# Patient Record
Sex: Female | Born: 1935 | Race: White | Hispanic: No | Marital: Married
Health system: Southern US, Community
[De-identification: ages and names within clinical notes are randomized; demographics above are authoritative.]

---

## 2000-09-30 ENCOUNTER — Other Ambulatory Visit: Admission: RE | Admit: 2000-09-30 | Discharge: 2000-09-30 | Payer: Self-pay | Admitting: Family Medicine

## 2004-08-20 ENCOUNTER — Ambulatory Visit: Payer: Self-pay | Admitting: Internal Medicine

## 2005-01-09 ENCOUNTER — Ambulatory Visit: Payer: Self-pay | Admitting: Unknown Physician Specialty

## 2005-10-22 ENCOUNTER — Ambulatory Visit: Payer: Self-pay | Admitting: Internal Medicine

## 2005-11-13 ENCOUNTER — Encounter: Payer: Self-pay | Admitting: Internal Medicine

## 2005-12-08 ENCOUNTER — Encounter: Payer: Self-pay | Admitting: Internal Medicine

## 2006-03-24 ENCOUNTER — Ambulatory Visit: Payer: Self-pay | Admitting: Internal Medicine

## 2006-10-27 ENCOUNTER — Ambulatory Visit: Payer: Self-pay | Admitting: Internal Medicine

## 2007-11-02 ENCOUNTER — Ambulatory Visit: Payer: Self-pay | Admitting: Internal Medicine

## 2008-03-18 ENCOUNTER — Emergency Department: Payer: Self-pay | Admitting: Emergency Medicine

## 2008-03-21 ENCOUNTER — Encounter: Admission: RE | Admit: 2008-03-21 | Discharge: 2008-03-21 | Payer: Self-pay | Admitting: *Deleted

## 2008-03-22 ENCOUNTER — Ambulatory Visit (HOSPITAL_BASED_OUTPATIENT_CLINIC_OR_DEPARTMENT_OTHER): Admission: RE | Admit: 2008-03-22 | Discharge: 2008-03-23 | Payer: Self-pay | Admitting: *Deleted

## 2008-11-02 ENCOUNTER — Ambulatory Visit: Payer: Self-pay | Admitting: Internal Medicine

## 2009-05-22 IMAGING — CR DG CHEST 2V
2 series · 2 of 2 positions shown · non-contrast
Comparison: None of

CLINICAL DATA: Preop respiratory exam for surgical repair wrist
fracture.

CHEST - 2 VIEW

[view not recorded (1 of 2)]
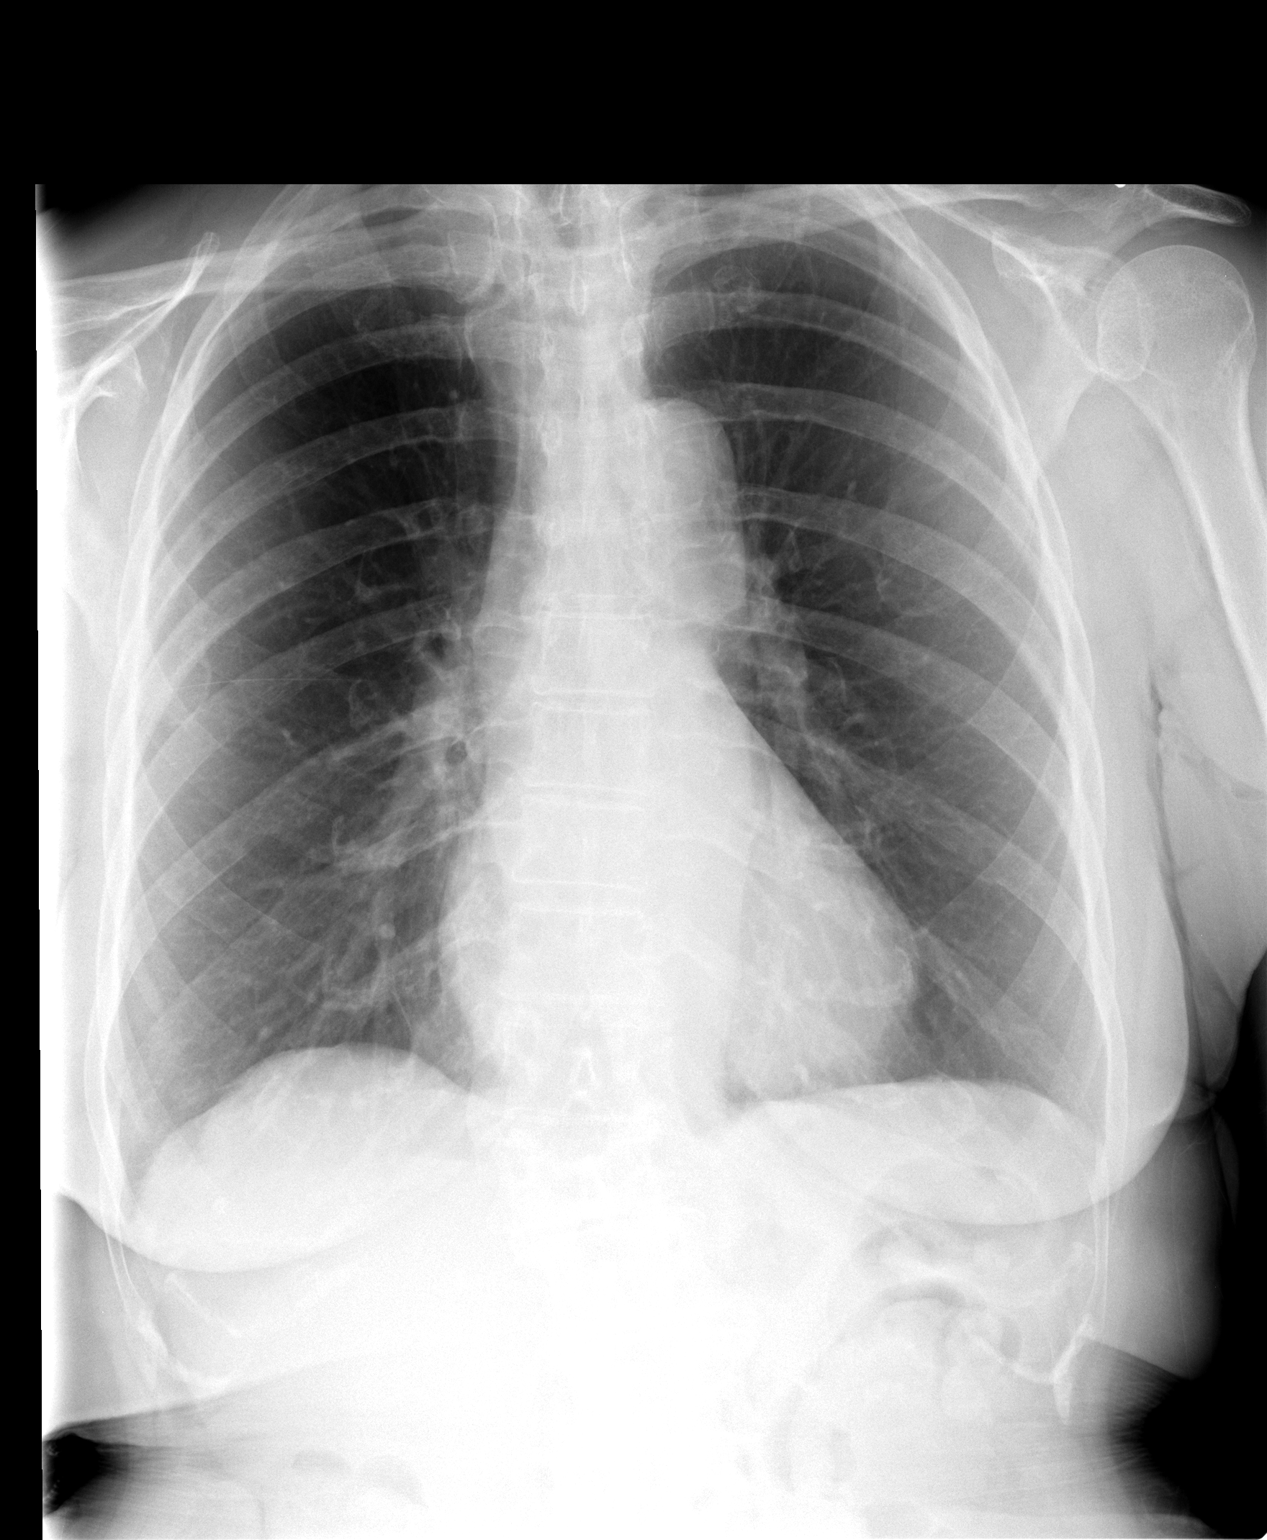

[view not recorded (2 of 2)]
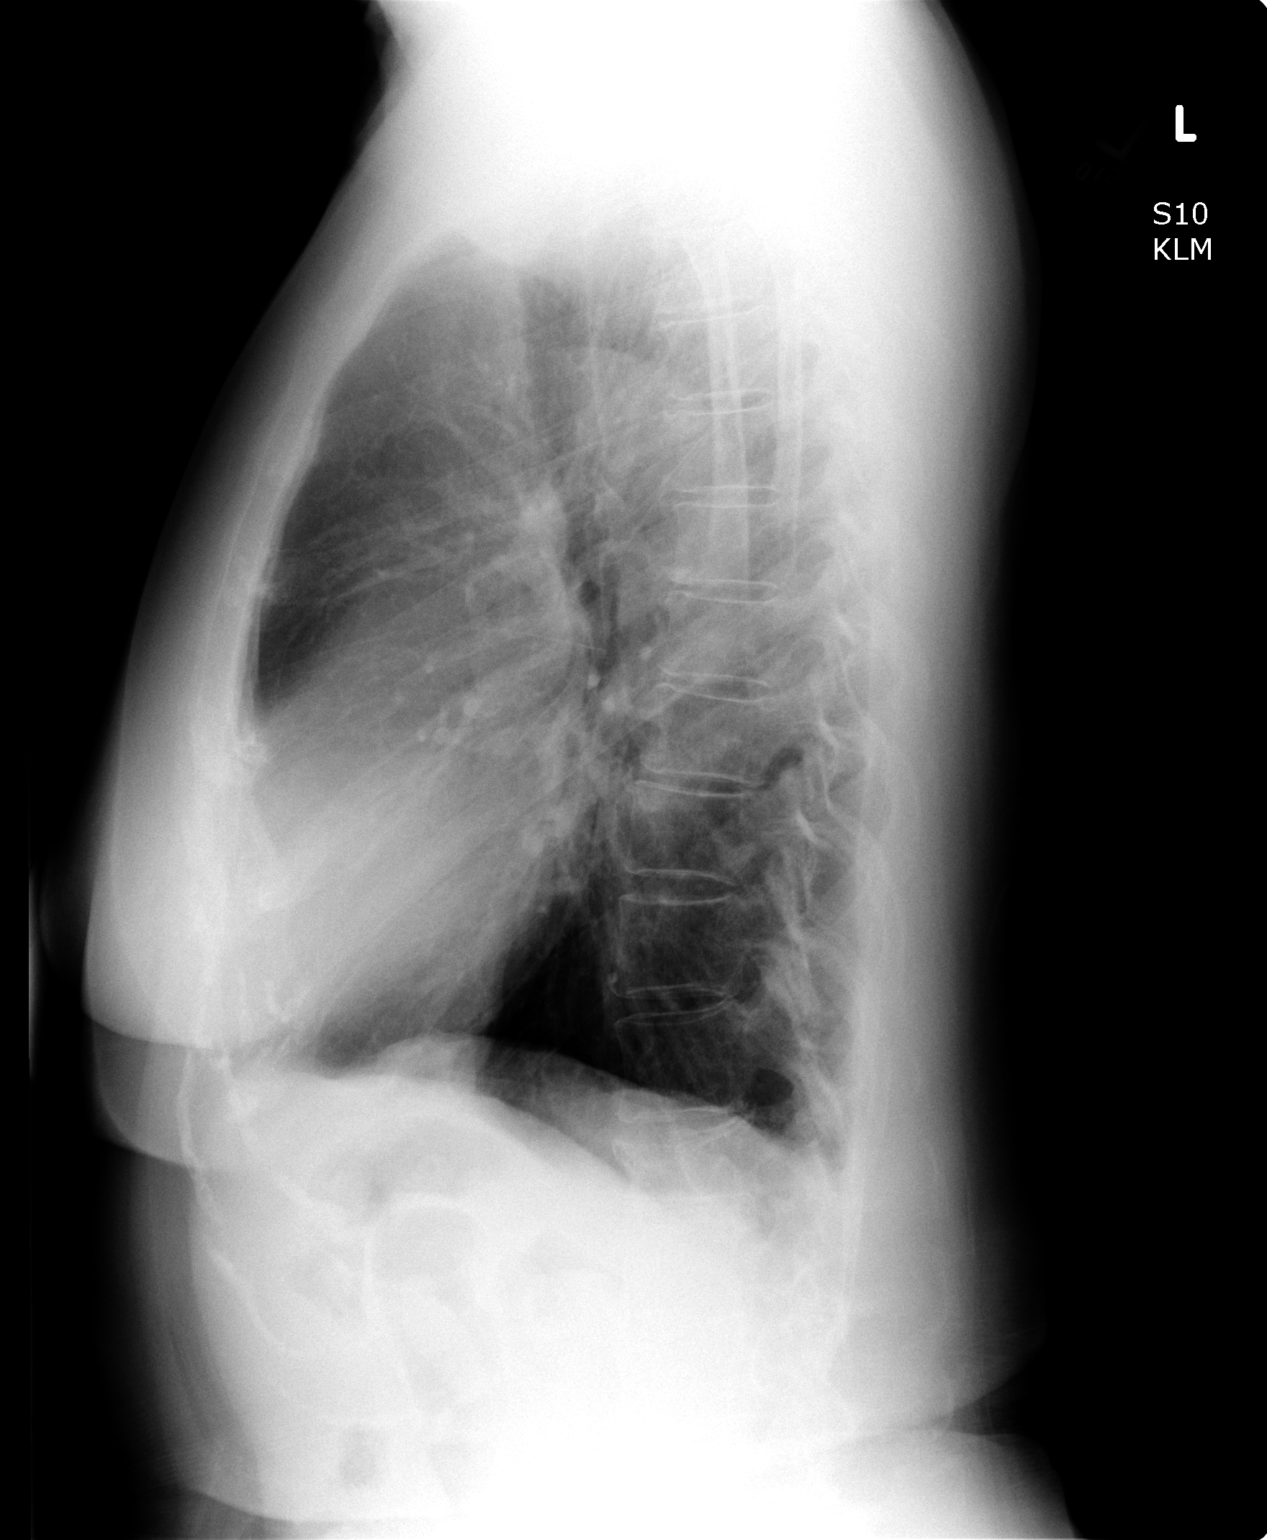

[2 of 2 positions shown; findings below may reference images not displayed]

FINDINGS: Lungs are clear.  Heart size is normal.  Mediastinum,
hila, pleura appear normal.  Slight levoscoliosis with multilevel
slight degenerative changes seen at thoracolumbar spine junction.
IMPRESSION: No active cardiopulmonary disease.

## 2009-11-28 ENCOUNTER — Ambulatory Visit: Payer: Self-pay | Admitting: Internal Medicine

## 2010-06-24 LAB — BASIC METABOLIC PANEL
BUN: 16 mg/dL (ref 6–23)
Chloride: 100 mEq/L (ref 96–112)
GFR calc Af Amer: >60 mL/min (ref >60)
GFR calc non Af Amer: >60 mL/min (ref >60)
Potassium: 3.8 mEq/L (ref 3.5–5.1)
Sodium: 137 mEq/L (ref 135–145)

## 2010-06-24 LAB — POCT HEMOGLOBIN-HEMACUE: Hemoglobin: 13.7 g/dL (ref 12.0–15.0)

## 2010-07-23 NOTE — Op Note (Signed)
[name], Valerie Roman NO.:  [id number]   MEDICAL RECORD NO.:  [id number]          PATIENT TYPE:  AMB   LOCATION:  DSC                          FACILITY:  [facility]   PHYSICIAN:  Tennis Must Meyerdierks, M.D.DATE OF BIRTH:  Apr 27, 1935   DATE OF PROCEDURE:  03/22/2008  DATE OF DISCHARGE:                               OPERATIVE REPORT   PREOPERATIVE DIAGNOSIS:  Fracture, left distal radius.   POSTOPERATIVE DIAGNOSIS:  Fracture, left distal radius.   PROCEDURE:  Open reduction and internal fixation, left distal radius.   SURGEON:  Lowell Bouton, MD   ANESTHESIA:  General.   OPERATIVE FINDINGS:  The patient had an extra-articular fracture of the  distal radius.  It was just proximal to the joint.   PROCEDURE:  Under general anesthesia with a tourniquet on the left arm.  Left hand was prepped and draped in usual fashion and after  exsanguinating the limb, the tourniquet was inflated to 250 mmHg.  A  longitudinal incision was made overlying the FCR tendon volarly.  Sharp  dissection was carried through the subcutaneous tissues and bleeding  points were coagulated.  Blunt dissection was carried down through the  FCR tendon sheath and the sheath was incised longitudinally with the  scissors.  The tendon was then retracted ulnarly.  The floor of the  sheath was incised with the scissors.  Blunt dissection was carried down  to the pronator quadratus and the FPL muscle belly was retracted  ulnarly.  The pronator was then released from its radial aspect sharply  and elevated ulnarly.  The fracture was then identified with a Glorious Peach and  was elevated.  There was 10 pounds of traction applied across the end of  the hand over the arm board.  The fracture was then reduced and x-ray  showed good alignment.  A DVR plate was then applied using the standard  short plate.  A 3.5-mm screws were inserted proximally and 4 pegs were  inserted distally.  X-ray showed good  position of the fracture.  The  wound was irrigated copiously with saline.  Pronator quadratus was  repaired with 4-0 Vicryl.  Subcutaneous tissue was closed with 4-0  Vicryl over a TLS drain.  The skin was closed with a 3-0 subcuticular  Prolene.  Steri-Strips were applied.  Marcaine 0.5% was placed in the  skin edges for pain control.  The patient was placed in a volar wrist  splint.  She tolerated the procedure well and went to the recovery room  awake in stable and good condition.       Lowell Bouton, M.D.  Electronically Signed     EMM/MEDQ  D:  03/22/2008  T:  03/23/2008  Job:  [id number]

## 2010-12-04 ENCOUNTER — Ambulatory Visit: Payer: Self-pay | Admitting: Internal Medicine

## 2011-12-05 ENCOUNTER — Ambulatory Visit: Payer: Self-pay | Admitting: Internal Medicine

## 2012-12-14 ENCOUNTER — Ambulatory Visit: Payer: Self-pay | Admitting: Internal Medicine

## 2015-01-23 ENCOUNTER — Other Ambulatory Visit: Payer: Self-pay | Admitting: Internal Medicine

## 2015-01-23 DIAGNOSIS — Z1231 Encounter for screening mammogram for malignant neoplasm of breast: Secondary | ICD-10-CM

## 2015-01-31 ENCOUNTER — Ambulatory Visit
Admission: RE | Admit: 2015-01-31 | Discharge: 2015-01-31 | Disposition: A | Discharge status: Home / Self Care | Payer: Medicare Other | Source: Ambulatory Visit | Attending: Internal Medicine | Admitting: Internal Medicine

## 2015-01-31 ENCOUNTER — Other Ambulatory Visit: Payer: Self-pay | Admitting: Internal Medicine

## 2015-01-31 DIAGNOSIS — Z1231 Encounter for screening mammogram for malignant neoplasm of breast: Secondary | ICD-10-CM | POA: Diagnosis not present

## 2015-12-24 ENCOUNTER — Other Ambulatory Visit: Payer: Self-pay | Admitting: Internal Medicine

## 2015-12-24 DIAGNOSIS — Z1231 Encounter for screening mammogram for malignant neoplasm of breast: Secondary | ICD-10-CM

## 2016-02-04 ENCOUNTER — Ambulatory Visit
Admission: RE | Admit: 2016-02-04 | Discharge: 2016-02-04 | Disposition: A | Discharge status: Home / Self Care | Payer: Medicare Other | Source: Ambulatory Visit | Attending: Internal Medicine | Admitting: Internal Medicine

## 2016-02-04 DIAGNOSIS — Z1231 Encounter for screening mammogram for malignant neoplasm of breast: Secondary | ICD-10-CM | POA: Diagnosis present

## 2017-03-18 ENCOUNTER — Other Ambulatory Visit: Payer: Self-pay | Admitting: Internal Medicine

## 2017-03-18 DIAGNOSIS — Z1231 Encounter for screening mammogram for malignant neoplasm of breast: Secondary | ICD-10-CM

## 2019-07-12 ENCOUNTER — Other Ambulatory Visit: Payer: Self-pay

## 2019-07-12 ENCOUNTER — Other Ambulatory Visit: Payer: Medicare Other | Admitting: Nurse Practitioner

## 2019-07-13 ENCOUNTER — Other Ambulatory Visit: Payer: Medicare Other | Admitting: Nurse Practitioner

## 2019-07-13 ENCOUNTER — Other Ambulatory Visit: Payer: Self-pay

## 2019-07-13 ENCOUNTER — Encounter: Payer: Self-pay | Admitting: Nurse Practitioner

## 2019-07-13 DIAGNOSIS — F0281 Dementia in other diseases classified elsewhere with behavioral disturbance: Secondary | ICD-10-CM

## 2019-07-13 DIAGNOSIS — Z515 Encounter for palliative care: Secondary | ICD-10-CM

## 2019-07-13 DIAGNOSIS — F02818 Dementia in other diseases classified elsewhere, unspecified severity, with other behavioral disturbance: Secondary | ICD-10-CM
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
## 2019-11-09 DEATH — deceased
